# Patient Record
Sex: Male | Born: 1935 | Race: White | Hispanic: No | Marital: Married
Health system: Southern US, Community
[De-identification: ages and names within clinical notes are randomized; demographics above are authoritative.]

---

## 2005-04-13 ENCOUNTER — Inpatient Hospital Stay: Payer: Self-pay | Admitting: Unknown Physician Specialty

## 2005-04-18 ENCOUNTER — Encounter: Payer: Self-pay | Admitting: Internal Medicine

## 2005-09-27 ENCOUNTER — Other Ambulatory Visit: Payer: Self-pay

## 2005-10-04 ENCOUNTER — Inpatient Hospital Stay: Payer: Self-pay | Admitting: Unknown Physician Specialty

## 2007-03-18 ENCOUNTER — Ambulatory Visit: Payer: Self-pay | Admitting: Unknown Physician Specialty

## 2007-09-17 ENCOUNTER — Ambulatory Visit: Payer: Self-pay | Admitting: Family Medicine

## 2007-12-23 ENCOUNTER — Ambulatory Visit: Payer: Self-pay | Admitting: Anesthesiology

## 2008-01-21 ENCOUNTER — Ambulatory Visit: Payer: Self-pay | Admitting: Anesthesiology

## 2008-02-18 ENCOUNTER — Ambulatory Visit: Payer: Self-pay | Admitting: Anesthesiology

## 2008-03-24 ENCOUNTER — Ambulatory Visit: Payer: Self-pay | Admitting: Anesthesiology

## 2008-05-05 ENCOUNTER — Ambulatory Visit: Payer: Self-pay | Admitting: Family Medicine

## 2008-10-26 ENCOUNTER — Ambulatory Visit: Payer: Self-pay | Admitting: Rheumatology

## 2009-01-05 ENCOUNTER — Ambulatory Visit: Payer: Self-pay | Admitting: Gastroenterology

## 2011-12-16 ENCOUNTER — Inpatient Hospital Stay: Payer: Self-pay | Admitting: Internal Medicine

## 2011-12-16 LAB — COMPREHENSIVE METABOLIC PANEL
Albumin: 1.9 g/dL — ABNORMAL LOW (ref 3.4–5.0)
Anion Gap: 9 (ref 7–16)
BUN: 26 mg/dL — ABNORMAL HIGH (ref 7–18)
Bilirubin,Total: 0.5 mg/dL (ref 0.2–1.0)
Calcium, Total: 7.7 mg/dL — ABNORMAL LOW (ref 8.5–10.1)
Chloride: 98 mmol/L (ref 98–107)
Potassium: 3.9 mmol/L (ref 3.5–5.1)
SGOT(AST): 24 U/L (ref 15–37)
SGPT (ALT): 7 U/L — ABNORMAL LOW
Sodium: 134 mmol/L — ABNORMAL LOW (ref 136–145)
Total Protein: 7.2 g/dL (ref 6.4–8.2)

## 2011-12-16 LAB — CBC
HCT: 36.4 % — ABNORMAL LOW (ref 40.0–52.0)
MCH: 28.7 pg (ref 26.0–34.0)
Platelet: 149 10*3/uL — ABNORMAL LOW (ref 150–440)
RBC: 4.11 10*6/uL — ABNORMAL LOW (ref 4.40–5.90)

## 2011-12-16 LAB — TROPONIN I: Troponin-I: <0.02 ng/mL

## 2011-12-17 LAB — URINALYSIS, COMPLETE
Bacteria: NONE SEEN
Bilirubin,UR: NEGATIVE
Glucose,UR: NEGATIVE mg/dL (ref 0–75)
Leukocyte Esterase: NEGATIVE
Nitrite: NEGATIVE
Ph: 5 (ref 4.5–8.0)
RBC,UR: NONE SEEN /HPF (ref 0–5)
Squamous Epithelial: NONE SEEN
WBC UR: 1 /HPF (ref 0–5)

## 2011-12-17 LAB — IRON AND TIBC
Iron Saturation: 13 %
Iron: 20 ug/dL — ABNORMAL LOW (ref 65–175)
Unbound Iron-Bind.Cap.: 138 ug/dL

## 2011-12-17 LAB — FOLATE: Folic Acid: 5.3 ng/mL (ref 3.1–100.0)

## 2011-12-22 LAB — CULTURE, BLOOD (SINGLE)

## 2011-12-29 ENCOUNTER — Ambulatory Visit: Payer: Self-pay | Admitting: Geriatric Medicine

## 2012-03-29 ENCOUNTER — Inpatient Hospital Stay: Payer: Self-pay | Admitting: Specialist

## 2012-03-29 LAB — COMPREHENSIVE METABOLIC PANEL
Alkaline Phosphatase: 78 U/L (ref 50–136)
Anion Gap: 10 (ref 7–16)
BUN: 32 mg/dL — ABNORMAL HIGH (ref 7–18)
Bilirubin,Total: 0.7 mg/dL (ref 0.2–1.0)
Chloride: 99 mmol/L (ref 98–107)
Co2: 28 mmol/L (ref 21–32)
Creatinine: 1.11 mg/dL (ref 0.60–1.30)
EGFR (African American): >60
EGFR (Non-African Amer.): 58 — ABNORMAL LOW
Potassium: 3.5 mmol/L (ref 3.5–5.1)
SGPT (ALT): 8 U/L — ABNORMAL LOW
Total Protein: 7.1 g/dL (ref 6.4–8.2)

## 2012-03-29 LAB — URINALYSIS, COMPLETE
Bacteria: NONE SEEN
Bilirubin,UR: NEGATIVE
Glucose,UR: NEGATIVE mg/dL (ref 0–75)
Ketone: NEGATIVE
Ph: 8 (ref 4.5–8.0)
RBC,UR: 89 /HPF (ref 0–5)
Specific Gravity: 1.014 (ref 1.003–1.030)
Squamous Epithelial: <1
WBC UR: 49 /HPF (ref 0–5)

## 2012-03-29 LAB — CBC
HCT: 33.9 % — ABNORMAL LOW (ref 40.0–52.0)
MCH: 27.1 pg (ref 26.0–34.0)
MCHC: 32.4 g/dL (ref 32.0–36.0)
MCV: 84 fL (ref 80–100)
Platelet: 257 10*3/uL (ref 150–440)
WBC: 11.4 10*3/uL — ABNORMAL HIGH (ref 3.8–10.6)

## 2012-03-29 LAB — PRO B NATRIURETIC PEPTIDE: B-Type Natriuretic Peptide: 1280 pg/mL — ABNORMAL HIGH (ref 0–450)

## 2012-03-30 LAB — BASIC METABOLIC PANEL
Anion Gap: 9 (ref 7–16)
BUN: 32 mg/dL — ABNORMAL HIGH (ref 7–18)
Calcium, Total: 8.1 mg/dL — ABNORMAL LOW (ref 8.5–10.1)
Chloride: 101 mmol/L (ref 98–107)
Co2: 28 mmol/L (ref 21–32)
Creatinine: 1 mg/dL (ref 0.60–1.30)
EGFR (Non-African Amer.): >60
Glucose: 92 mg/dL (ref 65–99)
Potassium: 3.2 mmol/L — ABNORMAL LOW (ref 3.5–5.1)
Sodium: 138 mmol/L (ref 136–145)

## 2012-03-30 LAB — CBC WITH DIFFERENTIAL/PLATELET
Eosinophil #: 0.6 10*3/uL (ref 0.0–0.7)
Eosinophil %: 6.2 %
HCT: 30.3 % — ABNORMAL LOW (ref 40.0–52.0)
HGB: 10.2 g/dL — ABNORMAL LOW (ref 13.0–18.0)
Lymphocyte #: 1 10*3/uL (ref 1.0–3.6)
MCV: 83 fL (ref 80–100)
Monocyte %: 5 %
Neutrophil #: 7.9 10*3/uL — ABNORMAL HIGH (ref 1.4–6.5)
Neutrophil %: 78.5 %
RBC: 3.65 10*6/uL — ABNORMAL LOW (ref 4.40–5.90)
RDW: 16.8 % — ABNORMAL HIGH (ref 11.5–14.5)
WBC: 10 10*3/uL (ref 3.8–10.6)

## 2012-03-30 LAB — MAGNESIUM: Magnesium: 1.7 mg/dL — ABNORMAL LOW

## 2012-03-31 LAB — BASIC METABOLIC PANEL
Anion Gap: 8 (ref 7–16)
Chloride: 102 mmol/L (ref 98–107)
Co2: 27 mmol/L (ref 21–32)
Creatinine: 1.01 mg/dL (ref 0.60–1.30)
EGFR (African American): >60
EGFR (Non-African Amer.): >60
Osmolality: 281 (ref 275–301)
Potassium: 3.1 mmol/L — ABNORMAL LOW (ref 3.5–5.1)

## 2012-03-31 LAB — CBC WITH DIFFERENTIAL/PLATELET
Basophil #: 0 10*3/uL (ref 0.0–0.1)
Basophil %: 0.3 %
Eosinophil #: 0.7 10*3/uL (ref 0.0–0.7)
HCT: 29.3 % — ABNORMAL LOW (ref 40.0–52.0)
HGB: 9.6 g/dL — ABNORMAL LOW (ref 13.0–18.0)
Lymphocyte #: 0.9 10*3/uL — ABNORMAL LOW (ref 1.0–3.6)
MCH: 27.6 pg (ref 26.0–34.0)
MCHC: 32.9 g/dL (ref 32.0–36.0)
MCV: 84 fL (ref 80–100)
Monocyte #: 0.3 x10 3/mm (ref 0.2–1.0)
Neutrophil #: 6 10*3/uL (ref 1.4–6.5)
RDW: 16.9 % — ABNORMAL HIGH (ref 11.5–14.5)
WBC: 8 10*3/uL (ref 3.8–10.6)

## 2012-03-31 LAB — URINE CULTURE

## 2012-03-31 LAB — MAGNESIUM: Magnesium: 1.7 mg/dL — ABNORMAL LOW

## 2012-04-03 LAB — CULTURE, BLOOD (SINGLE)

## 2012-05-28 DEATH — deceased

## 2014-05-09 IMAGING — CT CT HEAD WITHOUT CONTRAST
3 of 4 series · 16 of 30 positions shown, 18 images · non-contrast
Comparison: none

REASON FOR EXAM: altered mental status
COMMENTS:

[Series 2: soft tissue · axial · 0.50mm/px · z∈[-250,-125]mm · 6 of 37 slices shown]
[im 6/37  brain]
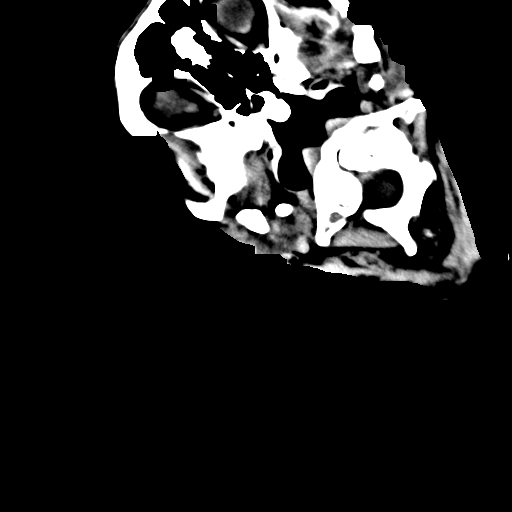
[im 11/37  brain]
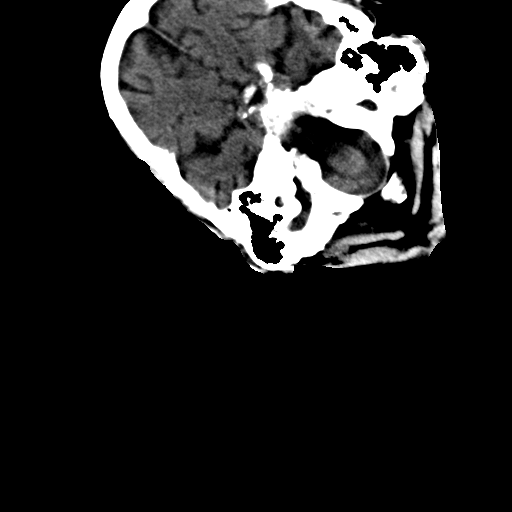
[im 16/37  brain]
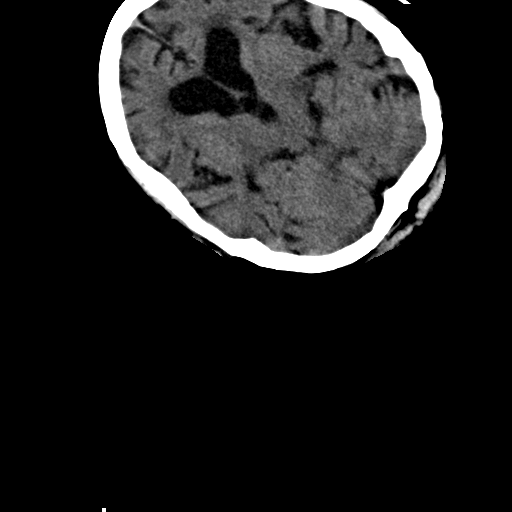
[im 21/37  brain]
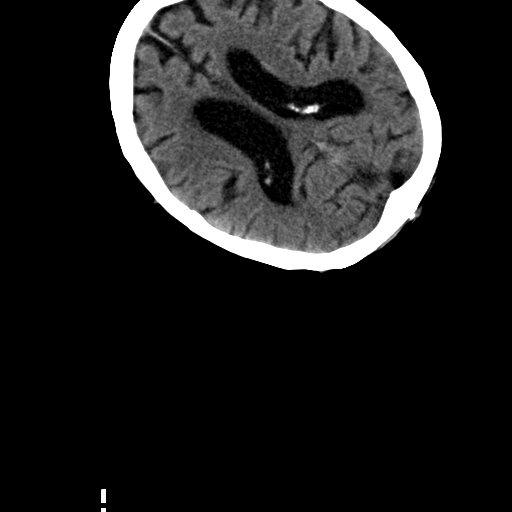
[im 26/37  brain]
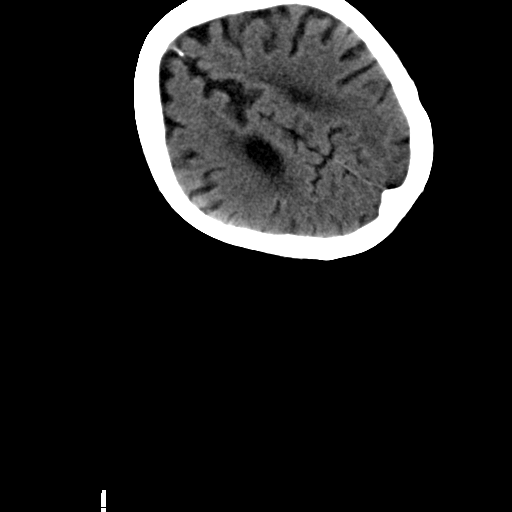
[im 31/37  brain]
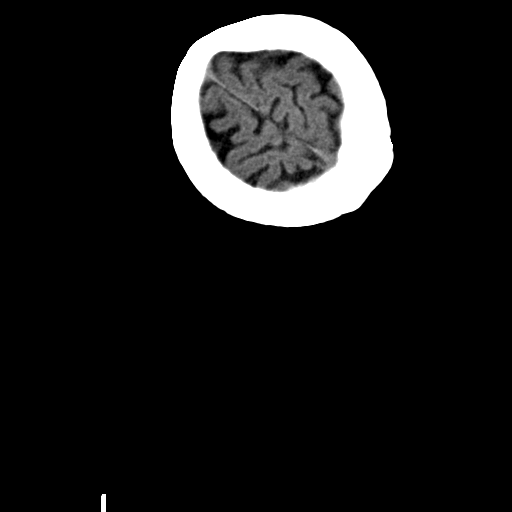

[Series 3: soft tissue recon · axial · 0.39mm/px · z∈[-268,-167]mm · 5 of 33 slices shown, 7 images]
[im 6/33  brain]
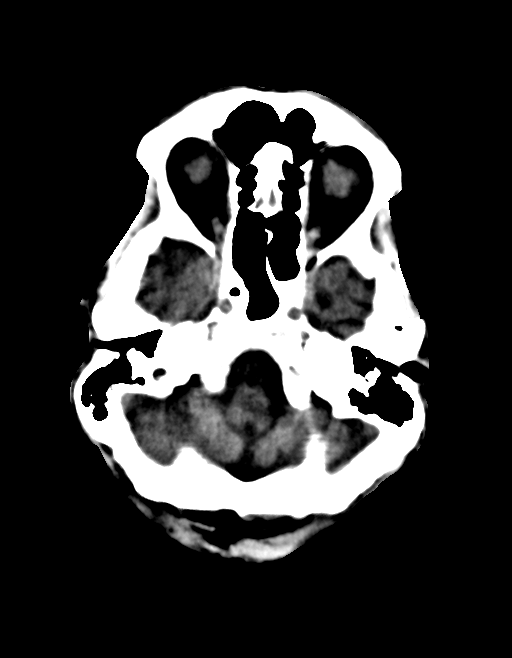
[im 6/33  bone]
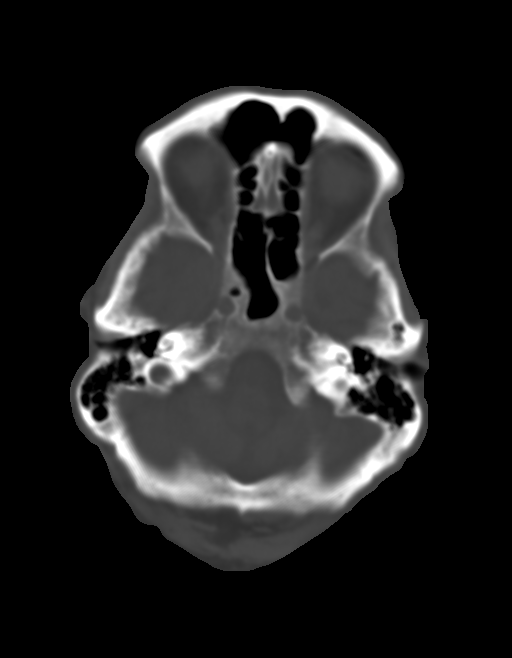
[im 11/33  brain]
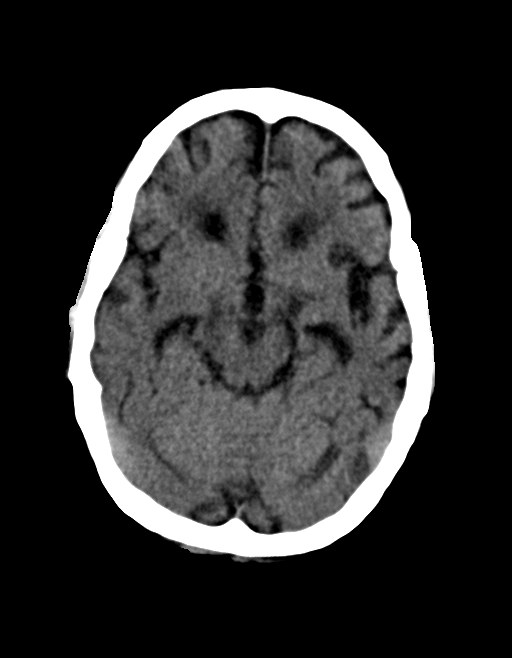
[im 17/33  brain]
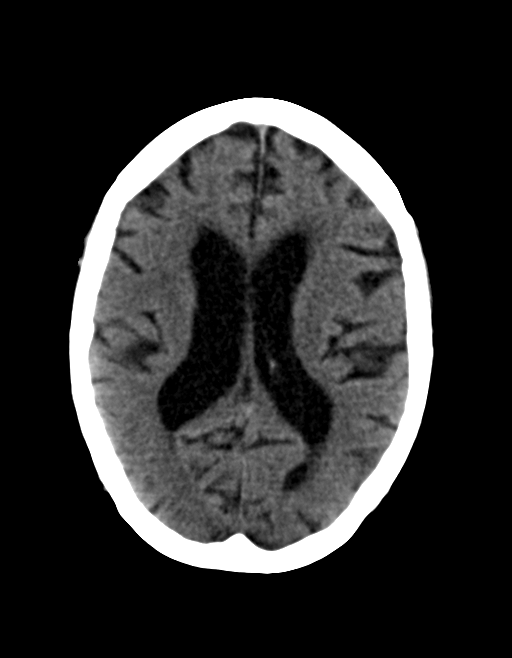
[im 22/33  brain]
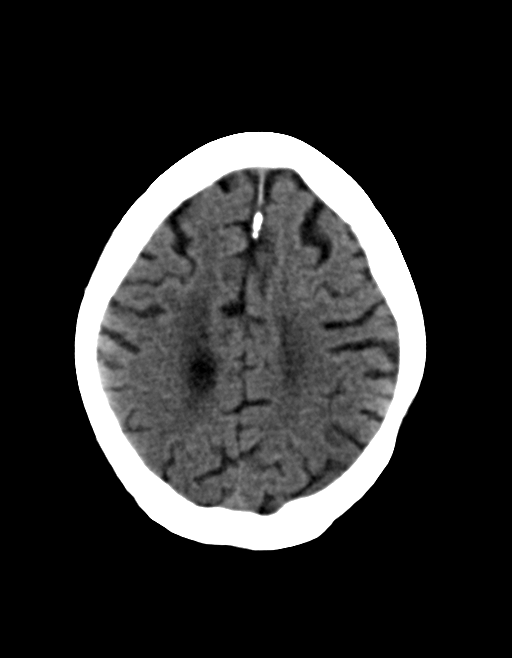
[im 27/33  brain]
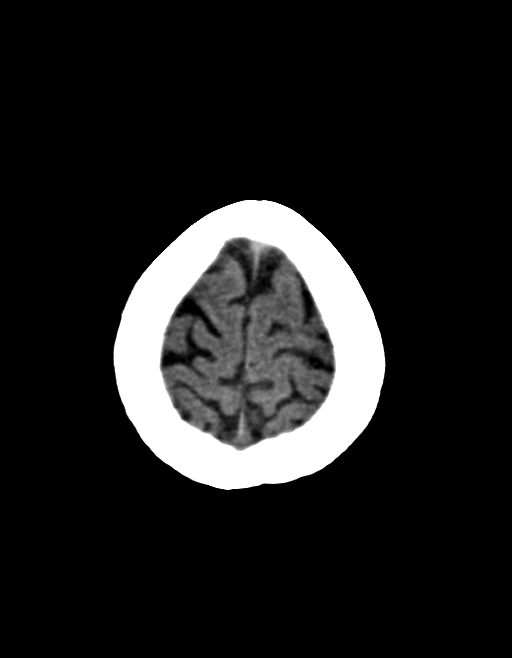
[im 27/33  bone]
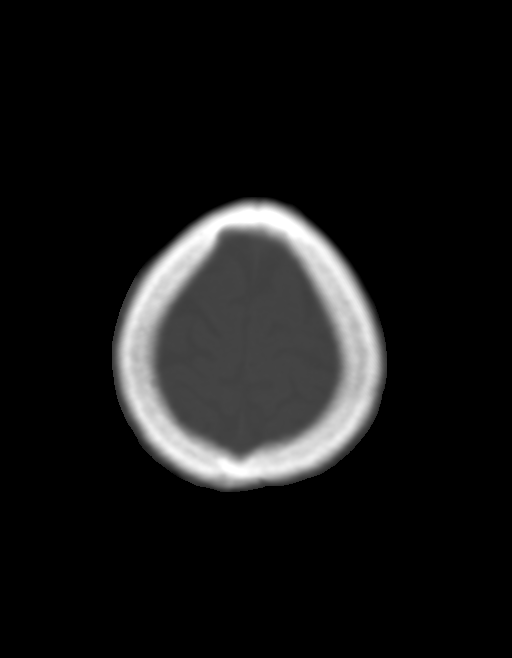

[Series 6: soft tissue open fov · axial · 0.50mm/px · z∈[-250,-150]mm · 5 of 37 slices shown]
[im 6/37  brain]
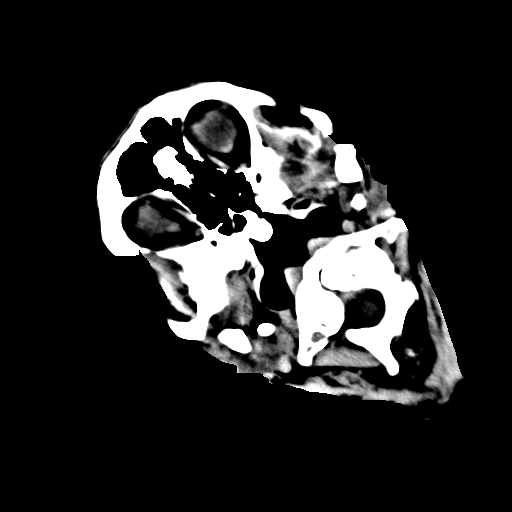
[im 11/37  brain]
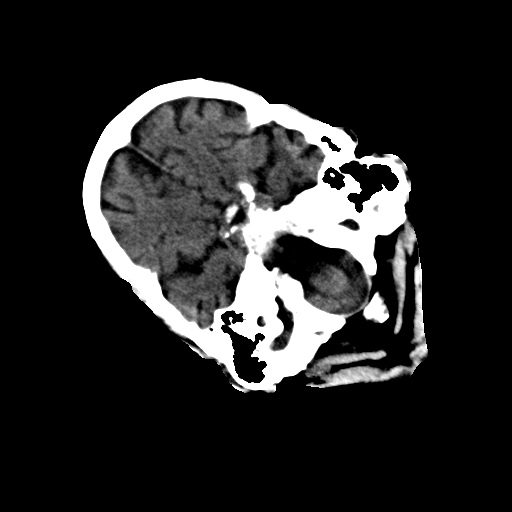
[im 16/37  brain]
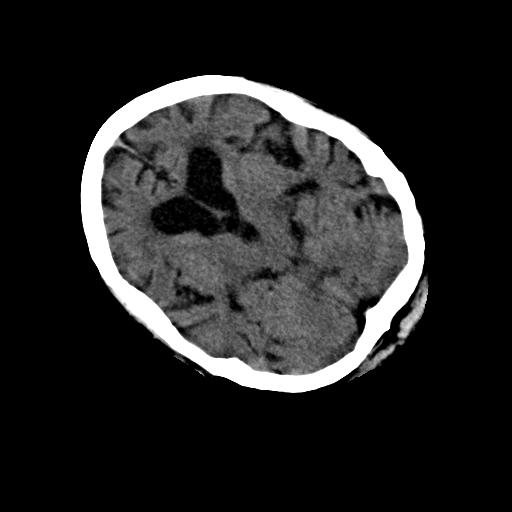
[im 21/37  brain]
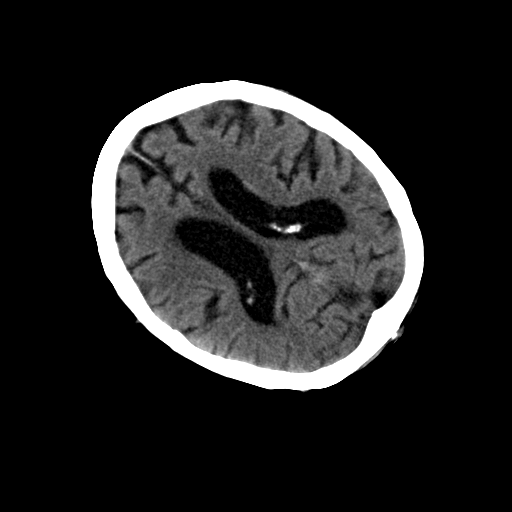
[im 26/37  brain]
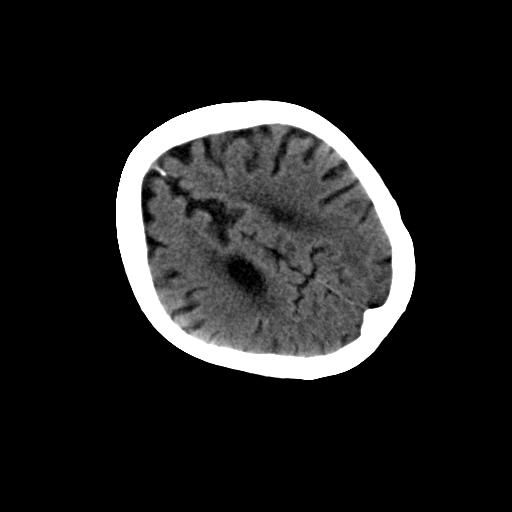

[16 of 30 positions shown; findings below may reference images not displayed]

PROCEDURE:     CT  - CT HEAD WITHOUT CONTRAST  - March 29, 2012  [DATE]

RESULT:     Imaging was performed with the head turned and reformatted
images are interpreted. There are no previous CT studies for comparison.

There is mild diffuse cerebral and cerebellar atrophy with compensatory
ventriculomegaly. There is no intracranial hemorrhage nor intracranial mass
effect. There is no evidence of an evolving ischemic infarction. The
cerebellum and brainstem exhibit no acute abnormality. There are no abnormal
intracranial calcifications. At bone window settings the observed portions
of the paranasal sinuses are clear. There is no evidence of an acute skull
fracture.
IMPRESSION: There are age-related changes present. There is no evidence
of an acute ischemic or hemorrhagic event nor intracranial mass effect.

## 2014-12-15 NOTE — Discharge Summary (Signed)
PATIENT NAME:  Lance Hawkins, Lance Hawkins MR#:  [id number] DATE OF BIRTH:  1920-03-07  DATE OF ADMISSION:  03/29/2012 DATE OF DISCHARGE:  04/01/2012  For a detailed note, please take a look at the history and physical done on admission by Dr. Darrick MeigsSangeeta Panwar.    DIAGNOSES AT DISCHARGE:  1. Encephalopathy, likely metabolic in nature, secondary to urinary tract infection and pneumonia.  2. Aspiration pneumonia.  3. Urinary tract infection.  4. Hypokalemia and hypomagnesemia, now resolved.  5. History of orthostatic hypotension.  6. History of chronic atrial fibrillation.   DIET: The patient is being discharged on a puree diet with nectar thick liquids.   ACTIVITY: As tolerated.  DISCHARGE FOLLOWUP: Followup with Dr. Terance HartBronstein in the next 1 to 2 weeks.   DISCHARGE MEDICATIONS: 1. Lasix 20 mg daily.  2. Magnesium oxide 400 mg daily.  3. Alfuzosin 10 mg daily.  4. Midodrine 2.5 mg three times daily. 5. Potassium 20 mEq twice a day. 6. Mucinex 600 mg twice a day. 7. Augmentin 500 mg/125 mg one tablet twice a day x7 days.   PERTINENT STUDIES: CT scan of the head done without contrast on admission showed age-related changes with no evidence of any acute intracranial process.   Chest x-ray done on admission showed increased density in the right lower lobe concerning for atelectasis versus developing pneumonia.   Blood cultures are consistent with coagulase-negative Staphylococcus, which is a contaminant.   Urine culture grew 20,000 colonies of Proteus mirabilis.   BRIEF HOSPITAL COURSE: This is a 81 year old male with medical problems as mentioned above who presented to the hospital secondary to altered mental status.  1. Encephalopathy/altered mental status. This was likely metabolic in nature secondary to suspected aspiration pneumonia, underlying urinary tract infection. The patient underwent a CT of the head which showed no evidence of acute intracranial process. After treatment with IV  antibiotics, the patient's clinical symptoms have significantly improved and his mental status is currently back down to baseline.  2. Pneumonia. This was likely aspiration pneumonia. The patient was seen by speech therapy and placed on a pureed diet with nectar thick liquids which he is tolerating well. He received IV Zosyn for his aspiration pneumonia. He has remained afebrile and stable. He currently is being discharged on a short course of p.o. Augmentin.  3. Urinary tract infection. The patient's urine culture grew out 20,000 colonies of Proteus mirabilis sensitive to ampicillin; therefore, the Augmentin should cover the urinary tract infection at the same time.  4. History of orthostatic hypotension. The patient had no evidence of any orthostasis. He will continue his Midodrine as stated.  5. Hypomagnesemia and hypokalemia. The patient was already on potassium supplements and magnesium supplements. He will resume those. His hypomagnesemia and hypokalemia has since then resolved.   DISPOSITION: The patient is a DO NOT INTUBATE, DO NOT RESUSCITATE. He is being discharged back to a skilled nursing facility.   TIME SPENT WITH DISCHARGE: 40 minutes.  ____________________________ Rolly PancakeVivek J. Cherlynn KaiserSainani, MD vjs:slb D: 04/01/2012 11:23:00 ET     T: 04/01/2012 11:39:33 ET        JOB#: [id number] cc: Rolly PancakeVivek J. Cherlynn KaiserSainani, MD, <Dictator> Teena Iraniavid M. Terance HartBronstein, MD Houston SirenVIVEK J Tynisa Vohs MD ELECTRONICALLY SIGNED 04/02/2012 8:01

## 2014-12-15 NOTE — H&P (Signed)
PATIENT NAME:  Lance Hawkins, Lance Hawkins MR#:  [id number] DATE OF BIRTH:  1920-02-22  DATE OF ADMISSION:  03/29/2012  REFERRING PHYSICIAN: ER physician, Dr Margarita GrizzleWoodruff.   PRIMARY CARE PHYSICIAN: Used to be Dr Terance HartBronstein, is currently [facility].  CHIEF COMPLAINT: Altered mental status.   HISTORY OF PRESENT ILLNESS: The patient is a 81 year old male with past medical history of pneumonitis and interstitial fibrosis, thrombocytopenia, anemia, hypertension who is actually currently on midodrine for orthostatic hypotension, was [facility] he has been a resident since May of 2013 for altered mental status. Patient very debilitated. He is currently awake, alert, and oriented; however, he is not able to move. As per the family, the skilled nursing facility was concerned about blood in urine, and he was not responding and acting right in the skilled nursing facility. The skilled nursing facility also felt that he was having recurrent aspiration and that his pneumonia that he had been admitted for in April 2013 had not cleared up completely, and he has had recurrent courses of antibiotic for which he had not been responding to. Patient has not walked since May of 2000 and is currently bedbound. Family reports that when patient drinks thin liquids fast he sometimes has some difficulty. His urinalysis shows possible urinary tract infection with pyuria and hematuria, and chest x-ray shows atelectasis versus pneumonia in the right lung base. His BNP is up but he is clinically dehydrated. EKG is irregular and shows possible atrial fibrillation.  ALLERGIES: No known drug allergies.   CURRENT MEDICATIONS: Alfuzosin 10 mg daily, Lasix 20 mg daily for 5 days, Mag-Ox 400 mg daily for 7 days, midodrine 2.5 mg t.i.d. for orthostatic hypotension, Mucinex 600 mg b.i.d.,  KCl 20 mEq b.i.d.   PAST MEDICAL HISTORY:  1. Hypertension. 2. Osteoarthritis. 3. Chronic kidney disease. 4. History of mild  hyponatremia.  5. Thrombocytopenia.  6. Anemia.  7. Possible pneumonitis/interstitial fibrosis.   PAST SURGICAL HISTORY:  1. Bilateral total knee replacement.  2. EGD and colonoscopy 2010. Colonoscopy showed a few polyps and diverticulosis.   SOCIAL HISTORY: Patient is an ex-smoker and alcohol user. He quit more than 25 years ago. He is a widower. He is currently is a resident of [facility]. He is retired from [employer] for more than 22 years.   FAMILY HISTORY: Mother died at the age of 79 from complications of congestive heart failure. Father died at the age of 79 from cerebral hemorrhage.  REVIEW OF SYSTEMS: CONSTITUTIONAL: The patient reports fatigue, weakness. EYES: Denies any blurred or double vision. ENT: Denies any tinnitus, ear pain. RESPIRATORY: Reports cough. Denies any wheezing. CARDIOVASCULAR: Denies any chest pain or palpitations. GI: Denies any nausea, vomiting, diarrhea, abdominal pain. GU: Denies any dysuria or hematuria. ENDOCRINE: Denies any polyuria or nocturia. HEME: Has anemia of chronic disease. Denies any bleeding. INTEGUMENTARY: Denies acne or rash. MUSCULOSKELETAL: Reports pain in his knees. NEUROLOGICAL: Denies any numbness or weakness. PSYCHIATRIC: Denies any anxiety or depression.   PHYSICAL EXAMINATION:  VITAL SIGNS: Temperature 98.9, heart rate 75, respiratory rate 28, blood pressure 101/59, pulse oximetry 98% on oxygen supplementation.   GENERAL: The patient is elderly, thin-built Caucasian male who appears very debilitated, lying in bed.   HEAD: Atraumatic, normocephalic.   EYES: There is pallor. No icterus or cyanosis. PERRLA, EOMI.     ENT: Dry mucous membranes. No oropharyngeal erythema or thrush.   NECK: Supple. No masses. No JVD. No  thyromegaly.   CHEST WALL: No tenderness to palpation. Not using accessory muscles of respiration. No intercostal muscle retractions.   LUNGS: The patient has bilateral crepitus.  No wheezing  or rhonchi.   CVS: S1, S2 irregular. There is a systolic murmur. No rubs or gallops.   ABDOMEN: Soft, nontender, nondistended. No guarding or rigidity. No organomegaly. Normal bowel sounds.   SKIN: Patient has some redness on his right knee. No other rashes or lesions. Peripheries: Trace pedal edema, 1+ pedal pulses.   MUSCULOSKELETAL: No cyanosis or clubbing.   NEUROLOGICAL: Currently the patient is alert and oriented. Moves all 4 extremities, is very debilitated and weak, nonfocal neurological exam.   PSYCHIATRIC: Normal mood and affect.   RESULTS: Urinalysis shows 2+ blood, 89 RBCs, 49 WBCs, positive LE.  CT head shows no acute abnormalities. White count 1.4, hematocrit 32.9, hemoglobin 11, platelet count 259,000. Glucose 110, BUN 32, creatinine 1.11, sodium 137, potassium 3.5, chloride 99. Rest of complete metabolic panel is normal. Cardiac enzymes negative. BNP 1280.   ASSESSMENT AND PLAN:  A 81 year old male with history of interstitial fibrosis, anemia, history of hypertension but now on midodrine for orthostatic hypotension who has not walked since 12/2011, presents with altered mental status.  1. Altered mental status. Currently the patient is awake and oriented but very debilitated.  His CT head is negative.  2. Right lung base pneumonia, possible aspiration as per the family. The patient has difficulty with thin liquids when he tries to drink them fast. We will obtain blood cultures, start on empiric antibiotics to cover nosocomial bacteria, start him on nebulizer treatments. We will obtain a speech evaluation as per the family. The skilled nursing facility has been wanting to do a speech evaluation of the patient sometime.   3. Possible urinary tract infection (UTI). Patient had hematuria, pyuria. We will obtain urine cultures and start empiric antibiotics. 4. Irregular heart rate, possible atrial fibrillation.  Heart rate is currently controlled. He is not a good candidate for  anticoagulation given his age and hematuria. He is actually on midodrine for hypertension, therefore we will not give him any medication. If required we will consider cardiology consultation. 5. Mild leukocytosis likely due to acute infection.  6. Anemia, likely of chronic disease, stable.  7. History of thrombocytopenia.  Currently patient's platelet count is normal. 8. Hyperglycemia, possibly reactive.  9. Mild dehydration. Patient is clinically dehydrated, although his BNP is up.  We will give him very gentle IV fluid hydration.  10. History of hypertension. The patient is actually on midodrine now for hypotension.  We will continue that.  11. Electrolytes. The patient was on magnesium and potassium supplementation. Currently his electrolytes are normal. We will hold his oral supplementation.  12. The patient is elderly with multiple problems. He is at high risk for complications and cardiopulmonary arrest. He has an out of facility DO NOT RESUSCITATE sheet from the skilled nursing facility. Reviewed all medical records, discussed with the ED physician, discussed with the patient and his family the plan of care and management.   TIME SPENT: 75 minutes.   ____________________________ Darrick Meigs, MD sp:vtd D: 03/29/2012 18:25:17 ET T: 03/30/2012 05:43:35 ET JOB#: [id number]  cc: Darrick Meigs, MD, <Dictator> Glenetta Borg, MD Darrick Meigs MD ELECTRONICALLY SIGNED 04/06/2012 7:35

## 2014-12-20 NOTE — H&P (Signed)
PATIENT NAME:  Lance Hawkins, Lance Hawkins MR#:  [id number] DATE OF BIRTH:  Mar 30, 1920  DATE OF ADMISSION:  12/16/2011  PRIMARY CARE PHYSICIAN: Dr. Dorothey Basemanavid Bronstein   CHIEF COMPLAINT: Generalized weakness, cough, and shortness of breath times 3 days.    HISTORY OF PRESENT ILLNESS: Lance Hawkins is a 81 year old pleasant Caucasian male who was in his usual state of health until the last 2 to 3 days. He stated that he developed generalized weakness that got worse progressively. He reported cough with little sputum production and some mild shortness of breath. He denies having any fever. No chills. No chest pain. No gastrointestinal or urinary complaints. The patient was evaluated in the Emergency Department and that revealed abnormal chest x-ray with interstitial lung findings and possible pneumonitis. The patient was admitted for further evaluation and treatment.   REVIEW OF SYSTEMS:  CONSTITUTIONAL: Denies any fever. No chills, but he reports fatigue. EYES: No blurring of vision. No double vision. ENT: No hearing impairment. No sore throat. No dysphagia. CARDIOVASCULAR: No chest pain. He reports shortness of breath that is mild. No edema. No syncope. RESPIRATORY: Reports cough and mild shortness of breath, little sputum production. GASTROINTESTINAL: No abdominal pain. No vomiting. No diarrhea. GENITOURINARY: No dysuria. No frequency of urination. MUSCULOSKELETAL: No joint pain or swelling. No muscular pain or swelling. INTEGUMENT: No skin rash. No ulcers. NEURO: No focal weakness. No seizure activity. No headache. PSYCH: No anxiety. No depression. ENDOCRINE: No polyuria or polydipsia. No heat or cold intolerance.   PAST MEDICAL HISTORY:  1. History of systemic hypertension.  2. History of osteoarthritis.  3. History of mild chronic kidney disease.   PAST SURGICAL HISTORY:  1. History of bilateral total knee replacements.  2. History of upper and lower endoscopy, that is EGD and colonoscopy in 2010. His colonoscopy  showed a few polyps and evidence of diverticulosis.   SOCIAL HABITS: Ex chronic smoker. He used to smoke a pipe only and he quit 25 years ago. He also quit alcohol 25 years ago.   SOCIAL HISTORY: He is widowed. Lives at home alone. His wife died in July from complications of chronic obstructive pulmonary disease. The patient is retired from working with the [employer] for 22 years.   FAMILY HISTORY: His mother died at age of 79 from complications of congestive heart failure. His father died at the age of 79 from cerebral hemorrhage.   ADMISSION MEDICATIONS:  The patient does not recall the names of his medications but he has one blood pressure pill and one what he called his urinary pill. It is unclear if he meant hydrochlorothiazide.   ALLERGIES: No known drug allergies.   PHYSICAL EXAMINATION:  VITAL SIGNS: Blood pressure 110/61, respiratory rate 20, pulse 85, temperature 98.4, oxygen saturation 94% on oxygen.   GENERAL APPEARANCE: Elderly male laying in bed in no acute distress.   HEAD: Examination revealed no pallor, no icterus, no cyanosis.   ENT:  Hearing was slightly impaired. Nasal mucosa, lips, and tongue were normal. Eye examination revealed normal eyelids and conjunctivae. Pupils about 4 mm, equal, and reactive to light.   NECK: Supple. Trachea at midline. No thyromegaly. No cervical lymphadenopathy. No masses.   HEART: Normal S1, S2. No S3, S4. No murmur. No gallop. No carotid bruits.   LUNGS: Normal breathing pattern without use of accessory muscles. There are scattered fine crackles in both lungs but more prominent on the left side, about two-thirds, but only a few on the right side. Few wheezes and rhonchi. Otherwise  unremarkable.   ABDOMEN: Soft without tenderness. No hepatosplenomegaly. No masses. No hernias.   SKIN: Dry skin, no ulcers. No subcutaneous nodules.   MUSCULOSKELETAL: No joint swelling. No clubbing.   NEUROLOGIC: Cranial nerves II through XII are intact. No  focal motor deficit.   PSYCHIATRIC: The patient is alert, oriented to place and people and time. Mood and affect were normal.   LABORATORY, DIAGNOSTIC, AND RADIOLOGICAL DATA: Chest x-ray showed interstitial fibrosis type of picture. There is more haziness on the left side of the chest with silhouette of the left cardiac border on the diaphragm, may indicate underlying pneumonitis or pneumonia. EKG showed normal sinus rhythm at a rate of 80 per minute.  First-degree AV block. Left bundle branch block. Serum glucose 92, BUN 26, creatinine 1.3, sodium was 134, potassium 3.9. His estimated GFR was 48, calcium was 7.7. His albumin is low at 1.9, total protein 7.2. The rest of his liver enzymes are normal. Troponin less than 0.02. CBC showed white count 5800, hemoglobin 11.8, hematocrit 36, platelet count 149.   ASSESSMENT:  1. Pneumonia. 2. Interstitial lung fibrosis.  3. Left bundle branch block by EKG. Unclear if it is old or new.  4. Normocytic, normochromic anemia.  5. Mild thrombocytopenia.  6. Mild hyponatremia.  7. Hypoalbuminemia.  8. History of systemic hypertension.  9. History of bilateral knee joint replacements.   PLAN: Admit the patient to the medical floor. Start IV antibiotics using Levaquin. Blood cultures were taken. Bronchodilator therapy using DuoNebs q. 6 hours while awake. Small dose of IV Solu-Medrol. Repeat chest x-ray in the next 2 to 3 days. Oxygen supplementation at 3 liters. Hopefully tomorrow morning one of his family members can bring his home medications. He is on two medications. I spoke with the patient regarding a LIVING WILL. He indicates that he does have a LIVING WILL and a copy of that is with his son-in-law who is also the caregiver.     TIME SPENT EVALUATING THIS PATIENT: More than 55 minutes.     ____________________________ Carney Corners. Rudene Re, MD amd:bjt D: 12/17/2011 00:01:22 ET T: 12/17/2011 09:41:23 ET JOB#: [id number]  cc: Carney Corners. Rudene Re, MD,  <Dictator> Teena Irani. Terance Hart, MD Zollie Scale MD ELECTRONICALLY SIGNED 12/17/2011 22:19

## 2014-12-20 NOTE — Discharge Summary (Signed)
PATIENT NAME:  [name], Lance MR#:  [id number] DATE OF BIRTH:  December 20, 1919  DATE OF ADMISSION:  12/16/2011 DATE OF DISCHARGE:  12/19/2011  DIAGNOSES:  1. Possible pneumonitis/interstitial fibrosis. 2. Normocytic, normochromic anemia of chronic disease.  3. Mild thrombocytopenia. 4. Mild hyponatremia.  5. Dehydration.  6. Hypertension.   DISPOSITION: Patient is being discharged to rehab facility.   DIET: Low sodium.   ACTIVITY: As tolerated.   DISCHARGE INSTRUCTIONS: Continue physical therapy. Follow up with primary care physician, Dr. Terance HartBronstein, in 1 to 2 weeks after discharge.   DISCHARGE MEDICATIONS:  1. Uroxatral 10 mg once a day at bedtime. 2. Levaquin 250 mg daily for five days. 3. Lisinopril 5 mg daily. Hold for systolic blood pressure less than 100. 4. Prednisone taper as prescribed.   LABORATORY, DIAGNOSTIC AND RADIOLOGICAL DATA: Chest x-ray showed superimposed interstitial fibrosis with possible superimposed pneumonitis. Blood cultures no growth in 48 hours. B12 normal. Folate level normal. White count normal, hemoglobin 11.8, hematocrit 36.4, platelets 149. Iron studies consistent with anemia of chronic disease. Sodium 134. Normal cardiac enzymes. Normal LFTs.   HOSPITAL COURSE: Patient is a 81 year old male with past medical history of hypertension who presented with severe weakness. He was found to have possible pneumonia/interstitial fibrosis. He was started on IV steroids and IV antibiotics with good response. His blood cultures have been negative so far. He has been switched to oral antibiotics and oral steroids at the time of discharge. He had mild normochromic normocytic anemia of chronic disease. Iron studies showed normal ferritin and low IBC. He had mild dehydration and hyponatremia on admission, which has resolved by the time of discharge. Initially when he presented his blood pressure was low, therefore all his antihypertensive medications were held. His blood pressure  is slowly improving therefore he has been restarted on low dose lisinopril with holding parameters. Patient is a DO NOT RESUSCITATE. He is being discharged in a stable condition.   TIME SPENT: 45 minutes.   ____________________________ Darrick MeigsSangeeta Json Koelzer, MD sp:cms D: 12/19/2011 10:48:44 ET T: 12/19/2011 11:32:20 ET JOB#: [id number]  cc: Darrick MeigsSangeeta Anjolie Majer, MD, <Dictator> Teena Iraniavid M. Terance HartBronstein, MD Darrick MeigsSANGEETA Tavish Gettis MD ELECTRONICALLY SIGNED 12/20/2011 13:21
